# Patient Record
Sex: Female | Born: 2000 | Race: White | Hispanic: No | Marital: Single | Smoking: Never smoker
Health system: Southern US, Community
[De-identification: ages and names within clinical notes are randomized; demographics above are authoritative.]

## PROBLEM LIST (undated history)

## (undated) DIAGNOSIS — J302 Other seasonal allergic rhinitis: Secondary | ICD-10-CM

## (undated) HISTORY — DX: Other seasonal allergic rhinitis: J30.2

---
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
## 2020-01-10 NOTE — Progress Notes (Signed)
Virtual Appointment Agreed To  Patient presents with right great toe pain. She denies any injury that she can recall. She admits to having some shin pain recently that caused her gait to change. She denies having any shin pain now. She denies any discoloration, swelling or pain of the toe at this time.  There is more discomfort with certain shoes.   ROS: Negative except mentioned above. No Vitals GENERAL: NAD MSK: no discoloration or swelling appreciated, points to right first toe at the IP joint as the area of tenderness, mild discomfort with flexion and extension  A/P: Right Great Toe Pain - will get xrays, if negative for any bony abnormality will ask trainer to look at shoewear to see if any changes can be made or something could be added in the shoe to prevent flexion of the toe. If symptoms persist or worsen recommend she see Dr. Ardine Eng. Ice, NSAIDs prn. Patient address understanding of the plan and discussed with trainer as well. Seek medical attention prn.

## 2020-02-01 ENCOUNTER — Other Ambulatory Visit: Payer: Self-pay

## 2020-02-01 ENCOUNTER — Encounter: Payer: Self-pay | Admitting: Family

## 2020-02-01 ENCOUNTER — Ambulatory Visit: Payer: PRIVATE HEALTH INSURANCE | Admitting: Family

## 2020-02-01 VITALS — BP 110/62 | Temp 97.7°F

## 2020-02-01 DIAGNOSIS — Z0000 Encounter for general adult medical examination without abnormal findings: Secondary | ICD-10-CM

## 2020-02-01 NOTE — Progress Notes (Signed)
   Established Patient Office Visit  Subjective:  Patient ID: Stacey Valencia, female    DOB: 11-Nov-2000  Age: 19 y.o. MRN: [id number]  CC:  Chief Complaint  Patient presents with  . 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    History reviewed. No pertinent past medical history.  History reviewed. No pertinent surgical history.  History reviewed. No pertinent family history.   ROS Review of Systems  Constitutional: Negative.   HENT: Negative.   Eyes: Negative.   Respiratory: Negative.   Cardiovascular: Negative.   Gastrointestinal: Negative.   Genitourinary: Negative.   Musculoskeletal: Negative.   Skin: Negative.   Hematological: Does not bruise/bleed easily.      Objective:    Physical Exam  Constitutional: She is oriented to person, place, and time. She appears well-developed and well-nourished.  HENT:  Head: Normocephalic.  Right Ear: External ear normal.  Left Ear: External ear normal.  Nose: Nose normal.  Mouth/Throat: Oropharynx is clear and moist.  Eyes: Pupils are equal, round, and reactive to light. EOM are normal.  Cardiovascular: Normal rate, regular rhythm and normal heart sounds.  Pulmonary/Chest: Effort normal and breath sounds normal.  Abdominal: Soft. Bowel sounds are normal.  Musculoskeletal:        General: Normal range of motion.     Cervical back: Normal range of motion and neck supple.  Neurological: She is alert and oriented to person, place, and time. No cranial nerve deficit.  Skin: Skin is warm and dry.    BP 110/62 (BP Location: Left Arm)   Temp 97.7 F (36.5 C)  Visual acuity: B 20/15, R 20/20, L 20/20     Assessment & Plan:   Pt cleared for participation as a lifeguard.  See scanned physical form signed in chart.  Pt will rtc prn.    No orders of the defined types were placed in this encounter.   Follow-up: No follow-ups on  file.    Therasa Lorenzi, Deirdre Peer, NP

## 2020-09-20 IMAGING — CR DG TOE GREAT 2+V*R*
1 series · 3 of 3 positions shown · non-contrast
Comparison: None.

CLINICAL DATA: Pain

EXAM:
RIGHT FIRST TOE: 3 V

[Series 1: dg toe great right · 0.14mm/px · 3 of 3 slices shown]
[im 1/3]
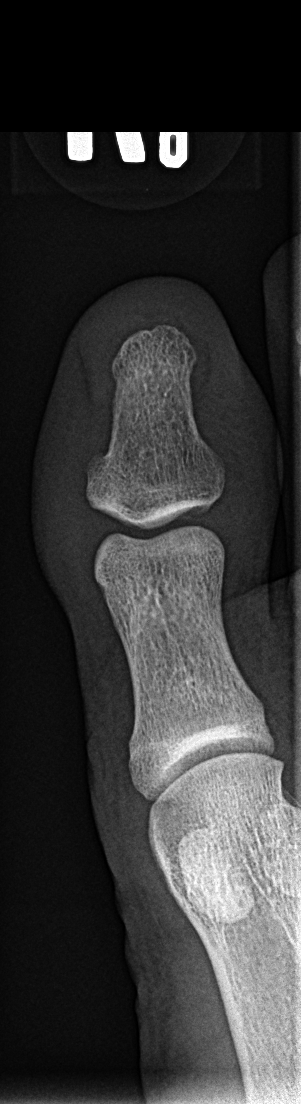
[im 2/3]
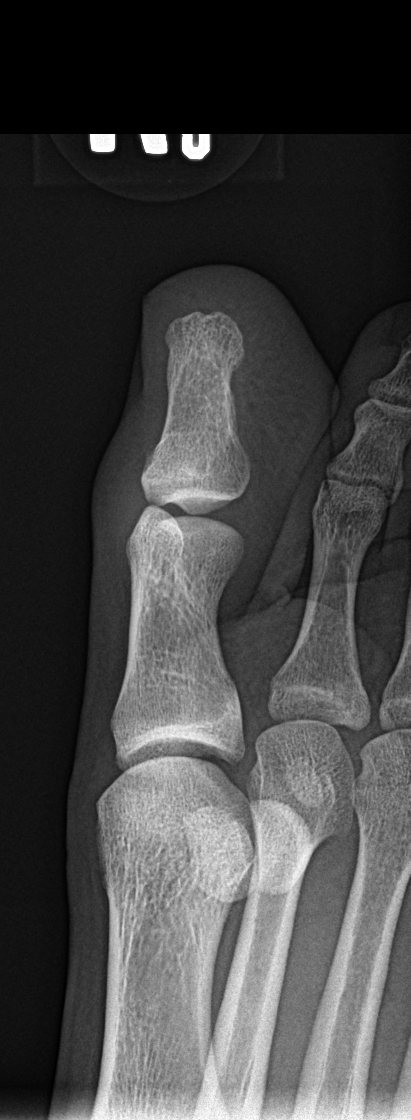
[im 3/3]
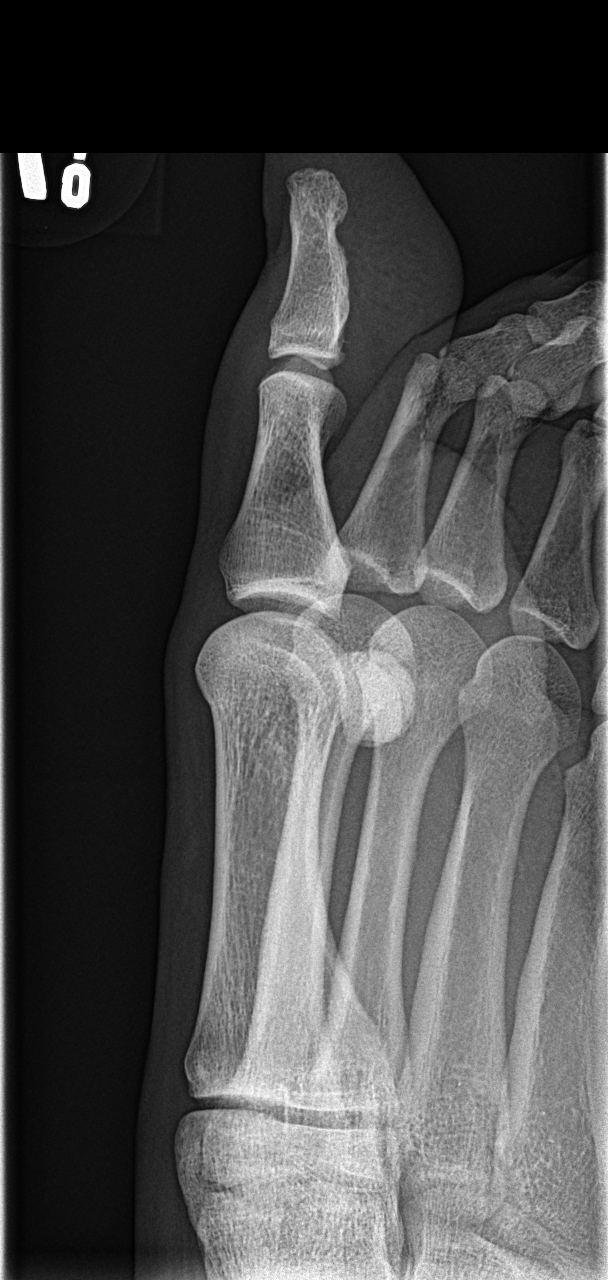

[3 of 3 positions shown; findings below may reference images not displayed]

FINDINGS: Frontal, oblique, and lateral views were obtained. No fracture or
dislocation. Joint spaces appear normal. No erosive change.
IMPRESSION: No fracture or dislocation.  No evident arthropathy.

## 2021-12-21 ENCOUNTER — Other Ambulatory Visit: Payer: Self-pay

## 2021-12-21 ENCOUNTER — Emergency Department
Admission: EM | Admit: 2021-12-21 | Discharge: 2021-12-21 | Disposition: A | Discharge status: Home / Self Care | Payer: 59 | Attending: Emergency Medicine | Admitting: Emergency Medicine

## 2021-12-21 ENCOUNTER — Encounter: Payer: Self-pay | Admitting: Emergency Medicine

## 2021-12-21 DIAGNOSIS — H5711 Ocular pain, right eye: Secondary | ICD-10-CM | POA: Diagnosis present

## 2021-12-21 DIAGNOSIS — L03213 Periorbital cellulitis: Secondary | ICD-10-CM | POA: Insufficient documentation

## 2021-12-21 MED ORDER — AMOXICILLIN-POT CLAVULANATE 875-125 MG PO TABS
1.0000 | ORAL_TABLET | Freq: Once | ORAL | Status: AC
  Administered 2021-12-21: 1 via ORAL
  Filled 2021-12-21: qty 1

## 2021-12-21 MED ORDER — IBUPROFEN 600 MG PO TABS
600.0000 mg | ORAL_TABLET | Freq: Once | ORAL | Status: AC
  Administered 2021-12-21: 600 mg via ORAL
  Filled 2021-12-21: qty 1

## 2021-12-21 MED ORDER — AMOXICILLIN-POT CLAVULANATE 875-125 MG PO TABS: 1.0000 | ORAL_TABLET | Freq: Two times a day (BID) | ORAL | 0 refills | Status: AC

## 2021-12-21 NOTE — ED Provider Notes (Signed)
[facility] Provider Note    Event Date/Time   First MD Initiated Contact with Patient 12/21/21 0421     (approximate)   History   Conjunctivitis   HPI  Stacey Valencia is a 21 y.o. female who presents for evaluation of right eyelid pain and swelling.  Patient reports that her symptoms started as pinkeye 2 days ago.  She has been on Cipro eyedrops.  She does wear contact lenses.  She reports that the swelling of her right upper eyelid has gotten progressively worse.  She denies changes in vision, headache, pain with extraocular movements, fever or chills.  Denies another medical problems.     History reviewed. No pertinent past medical history.  History reviewed. No pertinent surgical history.   Physical Exam   Triage Vital Signs: ED Triage Vitals  Enc Vitals Group     BP 12/21/21 0322 123/87     Pulse Rate 12/21/21 0322 63     Resp 12/21/21 0322 16     Temp 12/21/21 0322 98.5 F (36.9 C)     Temp Source 12/21/21 0322 Oral     SpO2 12/21/21 0322 100 %     Weight 12/21/21 0319 143 lb (64.9 kg)     Height 12/21/21 0319 5\' 6"  (1.676 m)     Head Circumference --      Peak Flow --      Pain Score 12/21/21 0319 4     Pain Loc --      Pain Edu? --      Excl. in GC? --     Most recent vital signs: Vitals:   12/21/21 0322 12/21/21 0429  BP: 123/87 123/87  Pulse: 63 63  Resp: 16 14  Temp: 98.5 F (36.9 C) 98.5 F (36.9 C)  SpO2: 100% 100%     Constitutional: Alert and oriented. Well appearing and in no apparent distress. HEENT:      Head: Normocephalic and atraumatic.         Eyes: Conjunctivae are normal. Sclera is non-icteric. R upper eyelid is swollen with no palpable abscess, EOMI, PERRL      Mouth/Throat: Mucous membranes are moist.       Neck: Supple with no signs of meningismus. Cardiovascular: Regular rate and rhythm.  Respiratory: Normal respiratory effort.  Musculoskeletal:  No edema, cyanosis, or erythema of  extremities. Neurologic: Normal speech and language. Face is symmetric. Moving all extremities. No gross focal neurologic deficits are appreciated. Skin: Skin is warm, dry and intact. No rash noted. Psychiatric: Mood and affect are normal. Speech and behavior are normal.  ED Results / Procedures / Treatments   Labs (all labs ordered are listed, but only abnormal results are displayed) Labs Reviewed - No data to display   EKG  none   RADIOLOGY none   PROCEDURES:  Critical Care performed: No  Procedures    IMPRESSION / MDM / ASSESSMENT AND PLAN / ED COURSE  I reviewed the triage vital signs and the nursing notes.   21 y.o. female who presents for evaluation of right eyelid pain and swelling.  Patient with swelling, erythema, warmth, and tenderness to palpation of the right upper eyelid with no eye involvement.  No palpable absence  Ddx: Preseptal cellulitis versus orbital cellulitis versus abscess   Plan: Oral antibiotics   MEDICATIONS GIVEN IN ED: Medications  amoxicillin-clavulanate (AUGMENTIN) 875-125 MG per tablet 1 tablet (1 tablet Oral Given 12/21/21 0424)  ibuprofen (ADVIL) tablet 600 mg (600 mg  Oral Given 12/21/21 0424)     ED COURSE: Patient with preseptal cellulitis.  No pain with extraocular movements, does not seem to have any eye involvement, pupils are equal round and reactive, no injected conjunctiva, no changes in vision, no fever, no headache.  Will start on Augmentin twice daily for 10 days.  Recommended warm compresses, no contact lenses until fully healed and close follow-up with primary care doctor.  No indication for admission at this time.  Recommended return to the emergency room for changes in vision, severe headache, fever, pain with extraocular movement, or symptoms that are not improving on the oral antibiotics.   Consults: None   EMR reviewed including records from patient's last visit to her PCP for annual checkup in 2021    FINAL  CLINICAL IMPRESSION(S) / ED DIAGNOSES   Final diagnoses:  Preseptal cellulitis of right upper eyelid     Rx / DC Orders   ED Discharge Orders          Ordered    amoxicillin-clavulanate (AUGMENTIN) 875-125 MG tablet  2 times daily        12/21/21 0419             Note:  This document was prepared using Dragon voice recognition software and may include unintentional dictation errors.   Please note:  Patient was evaluated in Emergency Department today for the symptoms described in the history of present illness. Patient was evaluated in the context of the global COVID-19 pandemic, which necessitated consideration that the patient might be at risk for infection with the SARS-CoV-2 virus that causes COVID-19. Institutional protocols and algorithms that pertain to the evaluation of patients at risk for COVID-19 are in a state of rapid change based on information released by regulatory bodies including the CDC and federal and state organizations. These policies and algorithms were followed during the patient's care in the ED.  Some ED evaluations and interventions may be delayed as a result of limited staffing during the pandemic.       Don Perking, [place], MD 12/21/21 0430

## 2021-12-21 NOTE — Discharge Instructions (Addendum)
Warm compresses several times a day. Augmentin twice a day for 10 days. Ibuprofen as needed for pain and swelling. Changes in vision, pain behind the eye, severe headache, fever or worsening symptoms please return to the ER. Otherwise follow up with your doctor in 2 days for re-evaluation/

## 2021-12-21 NOTE — ED Triage Notes (Signed)
Pt to ED from home c/o right eye conjunctivitis x2 days and has been on cipro eye drops but states getting worse.

## 2022-08-18 ENCOUNTER — Encounter: Payer: Self-pay | Admitting: Medical

## 2022-08-18 ENCOUNTER — Other Ambulatory Visit: Payer: Self-pay

## 2022-08-18 ENCOUNTER — Ambulatory Visit (INDEPENDENT_AMBULATORY_CARE_PROVIDER_SITE_OTHER): Payer: 59 | Admitting: Medical

## 2022-08-18 VITALS — BP 99/61 | HR 75 | Temp 98.2°F | Ht 65.35 in | Wt 144.0 lb

## 2022-08-18 DIAGNOSIS — Z0289 Encounter for other administrative examinations: Secondary | ICD-10-CM | POA: Diagnosis not present

## 2022-08-18 DIAGNOSIS — Z111 Encounter for screening for respiratory tuberculosis: Secondary | ICD-10-CM | POA: Diagnosis not present

## 2022-08-18 NOTE — Progress Notes (Signed)
[address] [place], [place] [address] Phone: [phone] Fax: [phone]   Office Visit Note  Patient Name: Stacey Valencia  Date of [id number]  Med Rec number [id number]  Date of Service: 08/18/2022  Patient has no known allergies.  Chief Complaint  Patient presents with   Form Completion     HPI 21 YO female presents for form completion and TB screening.  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 Needs vaccination form completed and TB screening. No recent TB skin tests. Has not yet had flu vaccine for current season.  Patient denies any other concerns at this time.   Current Medication:  No outpatient encounter medications on file as of 08/18/2022.   No facility-administered encounter medications on file as of 08/18/2022.      Medical History: History reviewed. No pertinent past medical history.   Vital Signs: BP 99/61   Pulse 75   Temp 98.2 F (36.8 C) (Tympanic)   Ht 5' 5.35" (1.66 m)   Wt 144 lb (65.3 kg)   SpO2 99%   BMI 23.70 kg/m   Review of Systems  Constitutional: Negative.       Physical Exam Vitals reviewed.  Constitutional:      General: She is not in acute distress. Neurological:     Mental Status: She is alert.      Assessment/Plan: 1. Screening examination for pulmonary tuberculosis - QuantiFERON-TB Gold Plus done - will send result through MyChart when available.  2. Encounter for completion of form with patient -Immunizations reviewed. Patient is up-to-date on required vaccines except for seasonal flu shot. Patient planning to get flu vaccine at local pharmacy in next few days, advised to send documentation to me through MyChart app so I can include this on the form as well.  -Will await result of Quantiferon-TB test.   Once these received, I will sign form and leave at front desk for patient to collect. Will notify patient by MyChart message when form is ready.     General Counseling: Kashauna verbalizes understanding of the findings of todays visit and agrees with plan of treatment. she has been encouraged to call the office with any questions or concerns that should arise related to todays visit.   Orders Placed This Encounter  Procedures   QuantiFERON-TB Gold Plus    No orders of the defined types were placed in this encounter.   Time spent:15 Faith PA-C [place] 08/18/2022 10:55 AM

## 2022-08-20 LAB — QUANTIFERON-TB GOLD PLUS
QuantiFERON Mitogen Value: >10 IU/mL
QuantiFERON Nil Value: 0.02 IU/mL
QuantiFERON TB1 Ag Value: 0.02 IU/mL
QuantiFERON TB2 Ag Value: 0.02 IU/mL
QuantiFERON-TB Gold Plus: NEGATIVE

## 2022-08-20 NOTE — Patient Instructions (Signed)
-  You will receive a MyChart message notifying you of your lab results when they are available.  -Please send me documentation through MyChart once you have received your flu shot so I can include it on your form.  -Once I receive your lab result and documentation of your flu shot, I will complete and sign your form. I will let you know (will send you a MyChart message) when it is ready to pick up from the front desk.

## 2022-09-16 ENCOUNTER — Other Ambulatory Visit: Payer: Self-pay

## 2022-09-16 ENCOUNTER — Encounter: Payer: Self-pay | Admitting: Medical

## 2022-09-16 ENCOUNTER — Ambulatory Visit (INDEPENDENT_AMBULATORY_CARE_PROVIDER_SITE_OTHER): Payer: 59 | Admitting: Medical

## 2022-09-16 VITALS — BP 92/60 | HR 58 | Temp 98.9°F | Wt 145.0 lb

## 2022-09-16 DIAGNOSIS — Z7184 Encounter for health counseling related to travel: Secondary | ICD-10-CM | POA: Diagnosis not present

## 2022-09-16 MED ORDER — TYPHIM VI 25 MCG/0.5ML IM SOLN: 0.5000 mL | Freq: Once | INTRAMUSCULAR | 0 refills | Status: AC

## 2022-09-16 MED ORDER — ACETAZOLAMIDE 125 MG PO TABS: 125.0000 mg | ORAL_TABLET | Freq: Two times a day (BID) | ORAL | 0 refills | Status: DC

## 2022-09-16 NOTE — Progress Notes (Signed)
[facility] [address] [place], [place] [address] Phone: [phone] Fax: [phone]   Office Visit Note  Patient Name: Stacey Valencia  Date of [id number]  Med Rec number [id number]  Date of Service: 09/16/2022  Patient has no known allergies.  Chief Complaint  Patient presents with   Travel Consult    For immunizations and prescriptions      HPI Will be traveling to [place] with Stacey Valencia trip for Thanksgiving break. Would like medicine for altitude sickness prevention. Reviewed [facility] immunization record in [place]. Last Tdap in 2019, also had Hep A/B series. Has not had Typhoid vaccine previously.    Current Medication:  No outpatient encounter medications on file as of 09/16/2022.   No facility-administered encounter medications on file as of 09/16/2022.      Medical History: History reviewed. No pertinent past medical history.   Vital Signs: BP 92/60   Pulse (!) 58   Temp 98.9 F (37.2 C) (Tympanic)   Wt 145 lb (65.8 kg)   SpO2 98%   BMI 23.87 kg/m    Review of Systems  Constitutional: Negative.     Physical Exam Vitals reviewed.  Constitutional:      General: She is not in acute distress.    Appearance: She is not ill-appearing.  Neurological:     Mental Status: She is alert.     Assessment/Plan: 1. Travel advice encounter Discussed timing of Acetazolamide and possible side effects. Prescription for injectable Typhoid vaccine sent to pharmacy. Patient advised she may not have full immunity to Typhoid at time of departure since it is recommended to complete vaccines at least 2 weeks before travel.  - acetaZOLAMIDE (DIAMOX) 125 MG tablet; Take 1 tablet (125 mg total) by mouth 2 (two) times daily for 9 days. Start medicine on day you leave the [place], continue until you start to descend in altitude.  Dispense: 18 tablet; Refill: 0 - typhoid polysaccharide (TYPHIM VI) 25 MCG/0.5ML injection; Inject 0.5 mLs into the muscle once for  1 dose.  Dispense: 0.5 mL; Refill: 0     General Counseling: Stacey Valencia verbalizes understanding of the findings of todays visit and agrees with plan of treatment. she has been encouraged to call the office with any questions or concerns that should arise related to todays visit.   No orders of the defined types were placed in this encounter.   No orders of the defined types were placed in this encounter.   Time spent:15 Minutes    Stacey Valencia Resides PA-C [facility] 09/16/2022 10:07 AM

## 2022-09-18 ENCOUNTER — Ambulatory Visit: Payer: 59 | Admitting: Medical

## 2022-09-18 DIAGNOSIS — Z7184 Encounter for health counseling related to travel: Secondary | ICD-10-CM

## 2022-09-18 MED ORDER — TYPHIM VI 25 MCG/0.5ML IM SOLN: 0.5000 mL | Freq: Once | INTRAMUSCULAR | 0 refills | Status: AC

## 2022-09-19 NOTE — Patient Instructions (Addendum)
Prescriptions should be available at pharmacy in 1-2 hours. Take Typhoid vaccine as soon as possible for best effect. You may not have full immunity to Typhoid at time of departure since it is recommended to complete vaccines at least 2 weeks before travel.

## 2022-09-30 ENCOUNTER — Other Ambulatory Visit: Payer: Self-pay | Admitting: Medical

## 2022-09-30 DIAGNOSIS — Z7184 Encounter for health counseling related to travel: Secondary | ICD-10-CM

## 2022-10-05 NOTE — Telephone Encounter (Signed)
Refill not appropriate

## 2022-11-16 ENCOUNTER — Ambulatory Visit (INDEPENDENT_AMBULATORY_CARE_PROVIDER_SITE_OTHER): Payer: 59 | Admitting: Medical

## 2022-11-16 ENCOUNTER — Other Ambulatory Visit: Payer: Self-pay

## 2022-11-16 ENCOUNTER — Encounter: Payer: Self-pay | Admitting: Medical

## 2022-11-16 VITALS — BP 108/72 | HR 67 | Temp 98.5°F | Wt 143.0 lb

## 2022-11-16 DIAGNOSIS — L0100 Impetigo, unspecified: Secondary | ICD-10-CM

## 2022-11-16 MED ORDER — MUPIROCIN 2 % EX OINT: 1.0000 | TOPICAL_OINTMENT | Freq: Three times a day (TID) | CUTANEOUS | 0 refills | Status: AC

## 2022-11-16 MED ORDER — MUPIROCIN 2 % EX OINT: 1.0000 | TOPICAL_OINTMENT | Freq: Three times a day (TID) | CUTANEOUS | 0 refills | Status: DC

## 2022-11-16 NOTE — Progress Notes (Unsigned)
[address] [place], [place] [address] Phone: [phone] Fax: [phone]   Office Visit Note  Patient Name: Stacey Valencia  Date of [id number]  Med Rec number [id number]  Date of Service: 11/16/2022  Patient has no known allergies.  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  Noted nasal congestion, HA, fatigue about 14 days ago. Had green nasal d/c. Had a telephone consultation with a family friend who prescribed a Zpack. Finished this 2 days ago.   Little less tired after abx. Congestion little better but still worse in AM and night. Has been using nasal saline spray. Notes dryness inside R nostril and outside of nose. Yellow nasal discharge. No F/C. No longer has HA. No sore throat or cough. No otc meds currently for nasal congestion.  Also has an irritated area to left lateral thigh about a week ago, some tender.   Friend recently had impetigo.   Current Medication:  Outpatient Encounter Medications as of 11/16/2022  Medication Sig   spironolactone (ALDACTONE) 50 MG tablet Take 50 mg by mouth 2 (two) times daily.   [DISCONTINUED] acetaZOLAMIDE (DIAMOX) 125 MG tablet Take 1 tablet (125 mg total) by mouth 2 (two) times daily for 9 days. Start medicine on day you leave the [place], continue until you start to descend in altitude.   No facility-administered encounter medications on file as of 11/16/2022.      Medical History: Past Medical History:  Diagnosis Date   Seasonal allergies      Vital Signs: BP 108/72   Pulse 67   Temp 98.5 F (36.9 C) (Tympanic)   Wt 143 lb (64.9 kg)   SpO2 97%   BMI 23.54 kg/m    Review of Systems  Physical Exam Vitals reviewed.  Constitutional:      General: She is not in acute distress.    Appearance: She is not ill-appearing.  HENT:     Head: Normocephalic.     Nose: Mucosal edema (mild, R>L) and rhinorrhea (small yellow  to right nostril) present. No congestion.     Right Sinus: No maxillary sinus tenderness or frontal sinus tenderness.     Left Sinus: No maxillary sinus tenderness or frontal sinus tenderness.     Comments: Small area with mild-moderate erythema and yellow crust to inferior nose. Moderate erythema inside right nostril.    Mouth/Throat:     Mouth: Mucous membranes are moist. No oral lesions.     Pharynx: No pharyngeal swelling or posterior oropharyngeal erythema.     Tonsils: No tonsillar exudate. 0 on the right. 0 on the left.  Cardiovascular:     Rate and Rhythm: Normal rate and regular rhythm.     Heart sounds: No murmur heard.    No friction rub. No gallop.  Pulmonary:     Effort: Pulmonary effort is normal.     Breath sounds: Normal breath sounds. No wheezing, rhonchi or rales.  Musculoskeletal:     Cervical back: Neck supple. No rigidity.  Lymphadenopathy:     Cervical: No cervical adenopathy.  Neurological:     Mental Status: She is alert.     Skin lesion to left posterolateral thigh    Assessment/Plan:   General Counseling: Kiandra verbalizes understanding of the findings of todays visit and agrees with plan of treatment. I have discussed any further diagnostic evaluation that may be needed or ordered today. We  also reviewed her medications today. she has been encouraged to call the office with any questions or concerns that should arise related to todays visit.   No orders of the defined types were placed in this encounter.   No orders of the defined types were placed in this encounter.   Time spent:*** Minutes    Jonathon Resides PA-C [facility] 11/16/2022 1:54 PM

## 2022-11-18 ENCOUNTER — Encounter: Payer: Self-pay | Admitting: Medical

## 2022-11-18 NOTE — Patient Instructions (Addendum)
-  Apply antibiotic ointment to affected areas three times a day as prescribed. -Wash affected areas with antibacterial soap (i.e. Dial hand soap) at least once a day.  -Avoid sharing washcloths/towels.  -Wash hands or use hand sanitizer frequently, especially after applying ointment.  -Send MyChart message to provider or schedule return visit as needed for new/worsening symptoms (i.e. increased redness, additional skin lesions) or if symptoms do not resolve as discussed with antibiotic ointment over next 5-7 days.
